# Patient Record
Sex: Male | Born: 1975 | Race: Black or African American | Hispanic: No | Marital: Married | Smoking: Never smoker
Health system: Southern US, Community
[De-identification: ages and names within clinical notes are randomized; demographics above are authoritative.]

---

## 2007-06-21 ENCOUNTER — Encounter: Admission: RE | Admit: 2007-06-21 | Discharge: 2007-06-21 | Payer: Self-pay | Admitting: Family Medicine

## 2007-07-08 ENCOUNTER — Encounter: Admission: RE | Admit: 2007-07-08 | Discharge: 2007-07-08 | Payer: Self-pay | Admitting: Family Medicine

## 2008-01-03 ENCOUNTER — Ambulatory Visit: Payer: Self-pay | Admitting: Hematology & Oncology

## 2008-01-23 LAB — CBC WITH DIFFERENTIAL (CANCER CENTER ONLY)
BASO#: 0 10*3/uL (ref 0.0–0.2)
BASO%: 0.6 % (ref 0.0–2.0)
EOS%: 2 % (ref 0.0–7.0)
Eosinophils Absolute: 0.1 10*3/uL (ref 0.0–0.5)
HCT: 44.2 % (ref 38.7–49.9)
HGB: 15.1 g/dL (ref 13.0–17.1)
LYMPH#: 1.8 10*3/uL (ref 0.9–3.3)
LYMPH%: 57.5 % — ABNORMAL HIGH (ref 14.0–48.0)
MCH: 30.4 pg (ref 28.0–33.4)
MCHC: 34.1 g/dL (ref 32.0–35.9)
MCV: 89 fL (ref 82–98)
MONO#: 0.3 10*3/uL (ref 0.1–0.9)
MONO%: 9.2 % (ref 0.0–13.0)
NEUT#: 1 10*3/uL — ABNORMAL LOW (ref 1.5–6.5)
NEUT%: 30.7 % — ABNORMAL LOW (ref 40.0–80.0)
Platelets: 299 10*3/uL (ref 145–400)
RBC: 4.96 10*6/uL (ref 4.20–5.70)
RDW: 12.8 % (ref 10.5–14.6)
WBC: 3.1 10*3/uL — ABNORMAL LOW (ref 4.0–10.0)

## 2008-01-23 LAB — CHCC SATELLITE - SMEAR

## 2009-07-28 ENCOUNTER — Encounter: Admission: RE | Admit: 2009-07-28 | Discharge: 2009-07-28 | Payer: Self-pay | Admitting: Neurosurgery

## 2010-01-04 ENCOUNTER — Encounter: Admission: RE | Admit: 2010-01-04 | Discharge: 2010-01-04 | Payer: Self-pay | Admitting: Rehabilitation

## 2010-04-25 ENCOUNTER — Encounter: Payer: Self-pay | Admitting: Family Medicine

## 2012-04-27 IMAGING — CT CT CERVICAL SPINE W/O CM
3 of 4 series · 16 of 28 positions shown, 18 images · non-contrast
Comparison: None.

CLINICAL DATA: 34-year-old male for evaluation of surgical
hardware.

CT CERVICAL SPINE WITHOUT CONTRAST
TECHNIQUE: Multidetector CT imaging of the cervical spine was
performed. Multiplanar CT image reconstructions were also
generated.

[Series 2: c spine bone · axial · 0.25mm/px · z∈[+8,+128]mm · 5 of 74 slices shown, 7 images]
[im 13/74  soft-tissue]
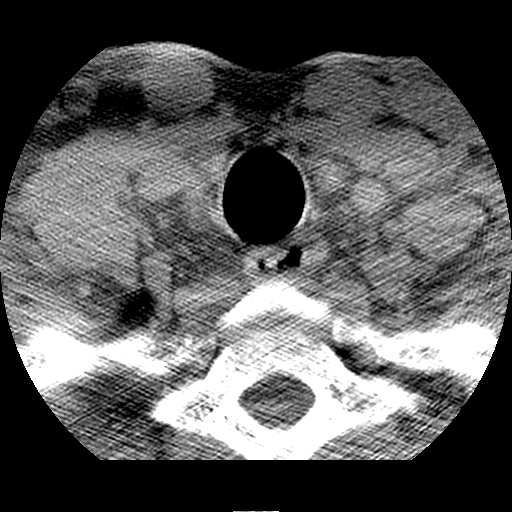
[im 13/74  bone]
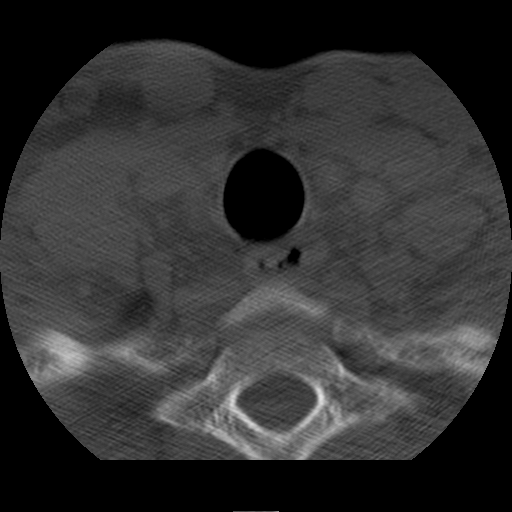
[im 25/74  bone]
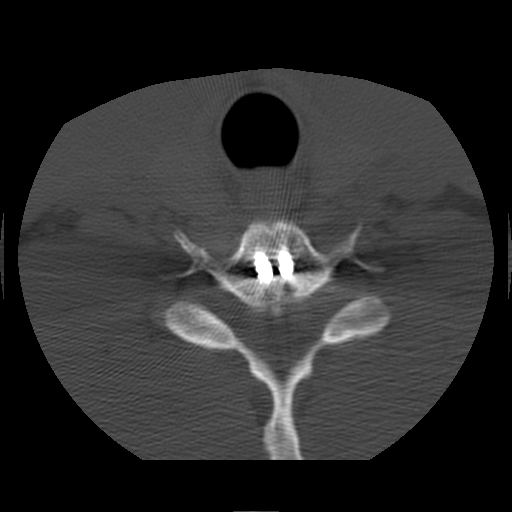
[im 37/74  bone]
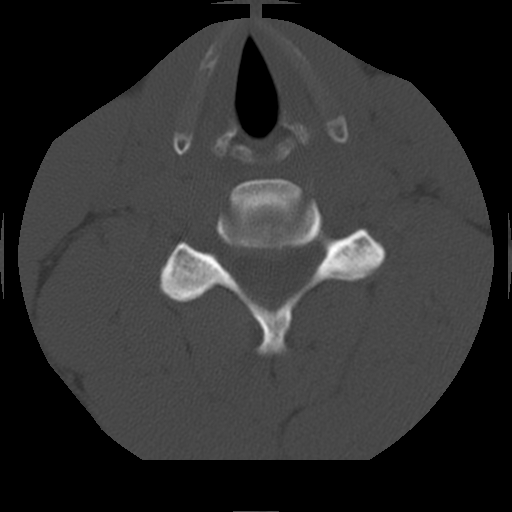
[im 49/74  bone]
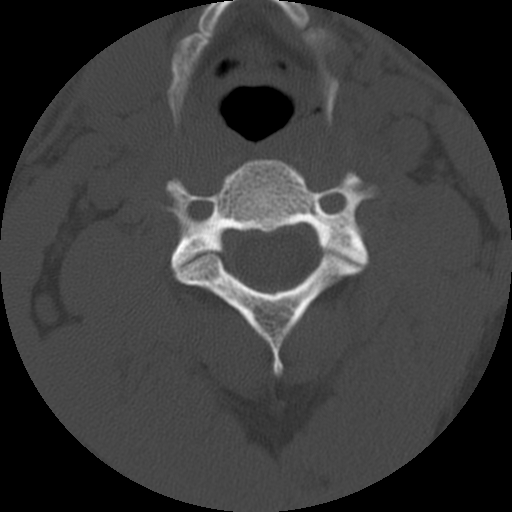
[im 61/74  soft-tissue]
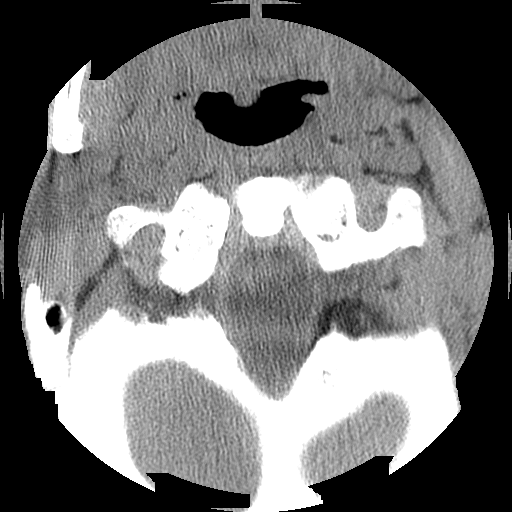
[im 61/74  bone]
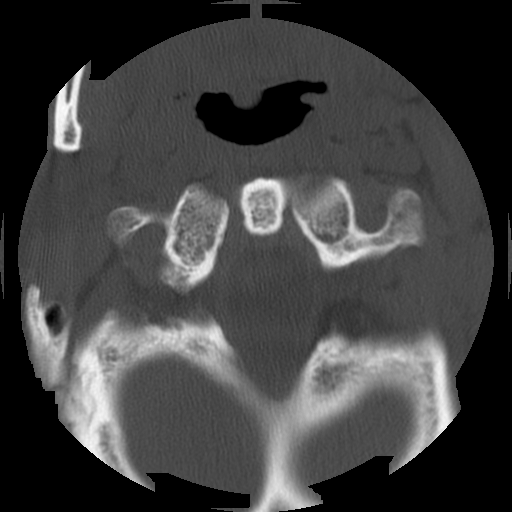

[Series 3: c spine soft · axial · 0.25mm/px · z∈[+8,+128]mm · 5 of 74 slices shown]
[im 13/74  soft-tissue]
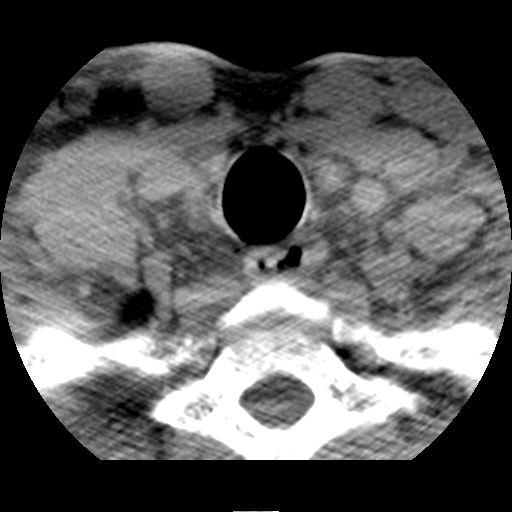
[im 25/74  soft-tissue]
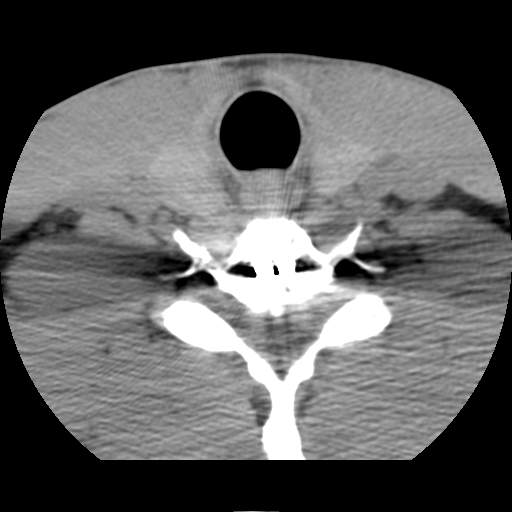
[im 37/74  soft-tissue]
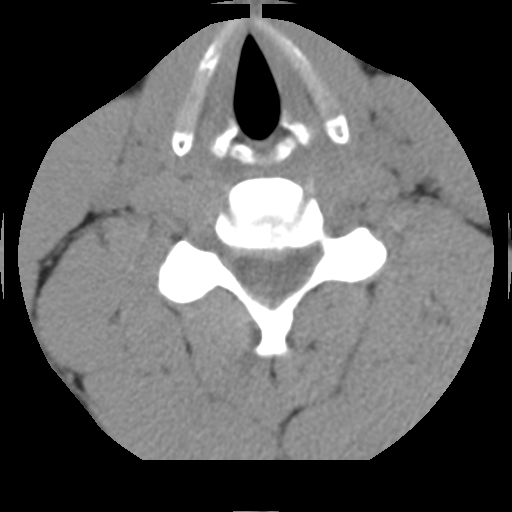
[im 49/74  soft-tissue]
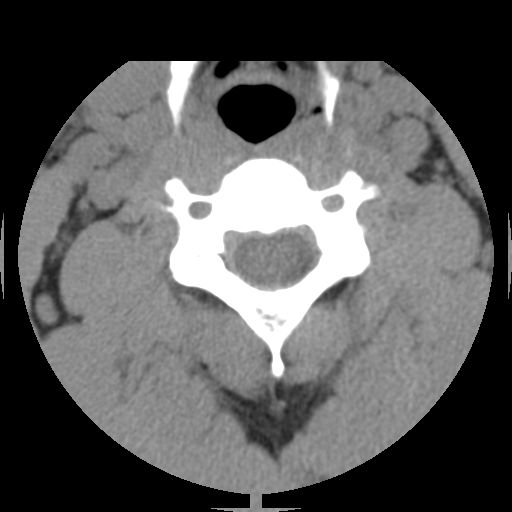
[im 61/74  soft-tissue]
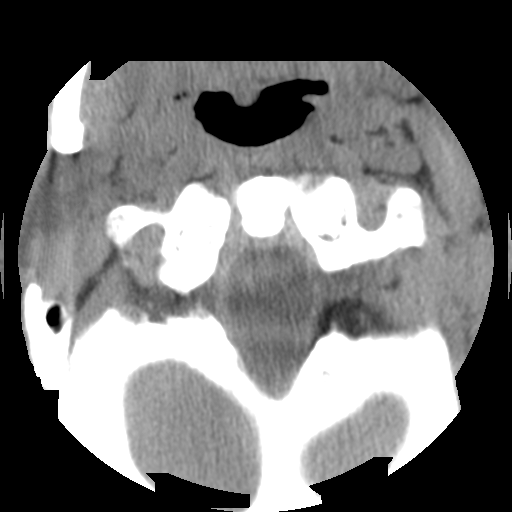

[Series 400: cor · coronal · 0.37mm/px · 6 of 40 slices shown]
[im 7/40  bone]
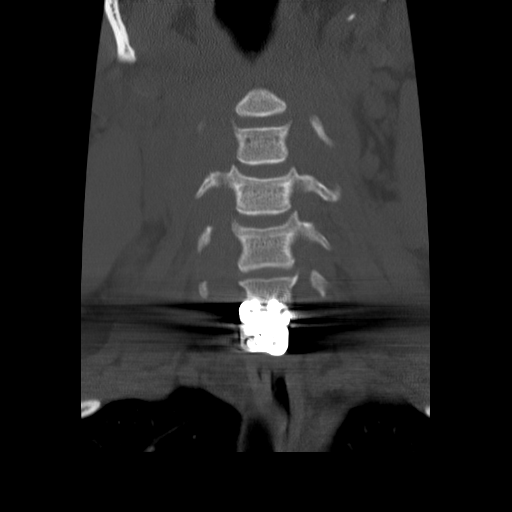
[im 11/40  soft-tissue]
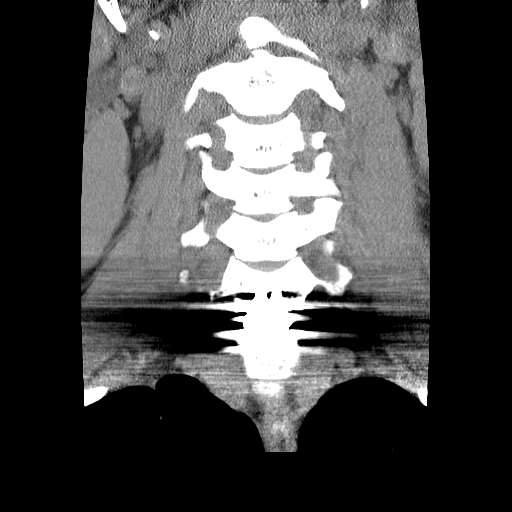
[im 14/40  bone]
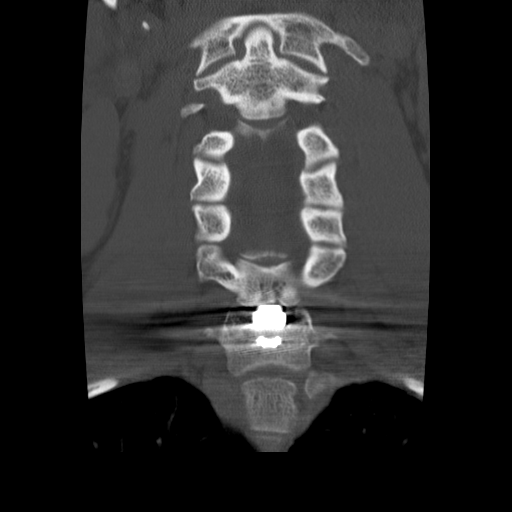
[im 20/40  bone]
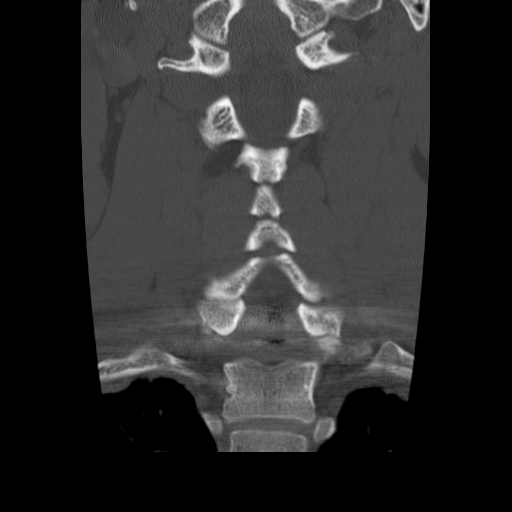
[im 27/40  bone]
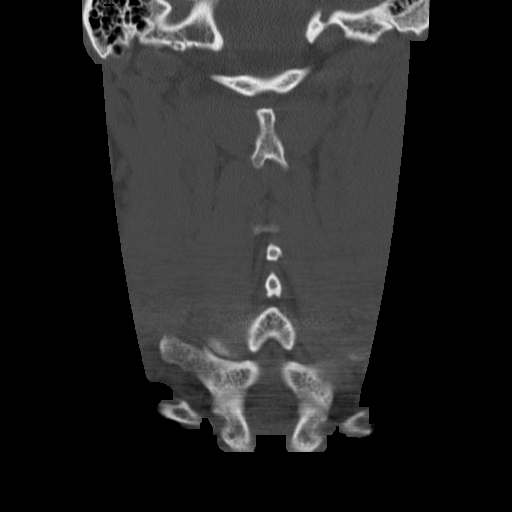
[im 33/40  bone]
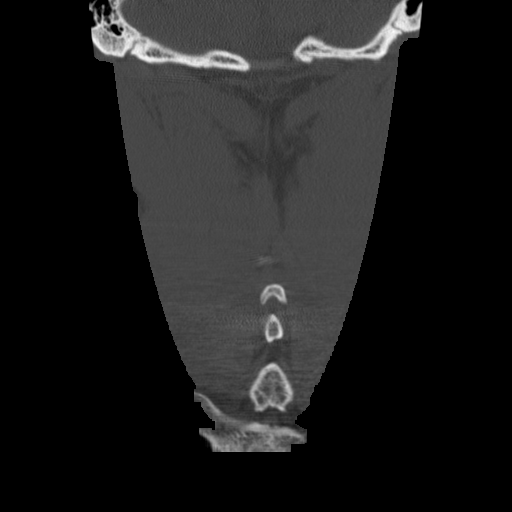

[16 of 28 positions shown; findings below may reference images not displayed]

FINDINGS: Visualized lung apices are clear.  Visualized paraspinal
soft tissues are within normal limits; there is significant CT
streak artifact at the operated level, C6-C7 related the hardware.
Normal alignment at the skull base.  Relatively preserved cervical
lordosis. Cervicothoracic junction alignment is within normal
limits.  Bilateral posterior element alignment is within normal
limits.  No acute osseous abnormality identified.

C2-C3:  Negative.

C3-C4:  Mild right eccentric disc protrusion.  Mild to moderate
uncovertebral hypertrophy greater on the right.  Right facet
hypertrophy.  Borderline spinal stenosis.  Mild to moderate right
C4 foraminal stenosis.

C4-C5:  Mild broad-based disc protrusion.  Mild facet and
uncovertebral hypertrophy.  Borderline to mild spinal stenosis.
Mild bilateral C5 foraminal stenosis.

C5-C6:  Circumferential disc osteophyte complex with left
paracentral focal protrusion.  AP thecal sac is estimated at 6 mm
suggesting mild to moderate spinal stenosis.  Mild right
uncovertebral hypertrophy.  Mild right C6 foraminal stenosis.

C6-C7:  Disc arthroplasty device in place.  Position appears within
normal limits.  Hardware appears intact.  Streak artifact at the
level obscures the thecal sac.  No definite foraminal stenosis.

C7-T1:  Negative.

T1-T2:  Mild uncovertebral hypertrophy, greater on the right.  Mild
right T1 foraminal stenosis.
IMPRESSION: 1.  C6-C7 disc arthroplasty device appears within normal limits.
Streak artifact from hardware obscures some detail of this level.
2.  Cervical degenerative changes elsewhere, with multifactorial
mild to moderate spinal stenosis suspected at C5-C6, and multilevel
predominately mild foraminal stenosis as detailed above.

## 2013-01-15 DIAGNOSIS — Z0289 Encounter for other administrative examinations: Secondary | ICD-10-CM

## 2017-07-30 ENCOUNTER — Other Ambulatory Visit: Payer: Self-pay

## 2017-07-30 ENCOUNTER — Encounter (HOSPITAL_COMMUNITY): Payer: Self-pay | Admitting: *Deleted

## 2017-07-30 ENCOUNTER — Emergency Department (HOSPITAL_COMMUNITY)
Admission: EM | Admit: 2017-07-30 | Discharge: 2017-07-30 | Disposition: A | Discharge status: Home / Self Care | Payer: 59 | Attending: Emergency Medicine | Admitting: Emergency Medicine

## 2017-07-30 DIAGNOSIS — M549 Dorsalgia, unspecified: Secondary | ICD-10-CM | POA: Diagnosis present

## 2017-07-30 MED ORDER — TRAMADOL HCL 50 MG PO TABS: 50.0000 mg | ORAL_TABLET | Freq: Four times a day (QID) | ORAL | 0 refills | Status: AC | PRN

## 2017-07-30 MED ORDER — HYDROCODONE-ACETAMINOPHEN 5-325 MG PO TABS
1.0000 | ORAL_TABLET | Freq: Once | ORAL | Status: AC
  Administered 2017-07-30: 1 via ORAL
  Filled 2017-07-30: qty 1

## 2017-07-30 NOTE — Discharge Instructions (Signed)
SEEK IMMEDIATE MEDICAL ATTENTION IF: New numbness, tingling, weakness, or problem with the use of your arms or legs.  Severe back pain not relieved with medications.  Change in bowel or bladder control.  Increasing pain in any areas of the body (such as chest or abdominal pain).  Shortness of breath, dizziness or fainting.  Nausea (feeling sick to your stomach), vomiting, fever, or sweats.  

## 2017-07-30 NOTE — ED Triage Notes (Signed)
The pt had a sudden onset of thoracic spine pain that started 2130  More severe 0200a  He took  of ibu then

## 2017-07-30 NOTE — ED Provider Notes (Signed)
[facility] EMERGENCY DEPARTMENT Provider Note   CSN: [id number] Arrival date & time: 07/30/17  7829     History   Chief Complaint Chief Complaint  Patient presents with  . Back Pain    HPI Blayde Bacigalupi is a 42 y.o. male  Who presents with c/o back pain. The patient has a hx of musculoskeletal back pain. He states that yesterday he was in class. He began having pain in his Upper R thoracic region. Pain worsened overnight. It is constatnt and non radiating. Worse with deep breathing mvmt of the R arm or leg. He was unable to sleep or find a comfortable position to sleep. He took 500 of robaxin, sleeping medicine and 800 of ibuprofen. He used moist heat and massage machine. He got no relief. His pain is worse with deep breathing, movement of the arm or leg, changing positions. He regularly gets dry needling and massage therapy.  He denies unilateral leg swelling, recent confinement surgery or injury, cigarette smoking, history of DVT or PE. HPI  History reviewed. No pertinent past medical history.  There are no active problems to display for this patient.   History reviewed. No pertinent surgical history.      Home Medications    Prior to Admission medications   Not on File    Family History No family history on file.  Social History Social History   Tobacco Use  . Smoking status: Never Smoker  . Smokeless tobacco: Never Used  Substance Use Topics  . Alcohol use: Yes  . Drug use: Not on file     Allergies   Patient has no known allergies.   Review of Systems Review of Systems  Ten systems reviewed and are negative for acute change, except as noted in the HPI.   Physical Exam Updated Vital Signs BP (!) 142/90   Pulse 76   Temp 98.7 F (37.1 C)   Resp 18   Ht  (1.93 m)   Wt 106.6 kg (235 lb)   SpO2 100%   BMI 28.61 kg/m   Physical Exam  Constitutional: He is oriented to person, place, and time. He appears well-developed and  well-nourished. No distress.  HENT:  Head: Normocephalic and atraumatic.  Eyes: Pupils are equal, round, and reactive to light. Conjunctivae and EOM are normal. No scleral icterus.  Neck: Normal range of motion. Neck supple.  Cardiovascular: Normal rate, regular rhythm and normal heart sounds.  Pulmonary/Chest: Effort normal and breath sounds normal. No respiratory distress.  Abdominal: Soft. There is no tenderness.  Musculoskeletal: He exhibits no edema.  Spastic tissue of the entire right side of the Back. Multiple trigger points. Pain illicited with movement of the arm, twisting  Neurological: He is alert and oriented to person, place, and time.  Skin: Skin is warm and dry. He is not diaphoretic.  Psychiatric: His behavior is normal.  Nursing note and vitals reviewed.     ED Treatments / Results  Labs (all labs ordered are listed, but only abnormal results are displayed) Labs Reviewed - No data to display  EKG None  Radiology No results found.  Procedures Procedures (including critical care time)  Medications Ordered in ED Medications - No data to display   Initial Impression / Assessment and Plan / ED Course  I have reviewed the triage vital signs and the nursing notes.  Pertinent labs & imaging results that were available during my care of the patient were reviewed by me and considered  in my medical decision making (see chart for details).     Patient with apparent musculoskeletal back pain.  He is PERC negative. Patient given pain medications and tramadol at discharge.  He has access to outpatient care and is advised to follow closely with his PCP.  Gust return precautions with the patient.  He appears appropriate for discharge.  Discussed with Dr. Erma Heritage who agrees with discharge.  Final Clinical Impressions(s) / ED Diagnoses   Final diagnoses:  Musculoskeletal back pain    ED Discharge Orders    None       Arthor Captain, PA-C 07/30/17 1608      Shaune Pollack, MD 07/31/17 1330

## 2017-07-30 NOTE — ED Notes (Signed)
Pt ambulated to [facility] slowly- c/o id back pain, with spasms-- started approx 10pm, states he took robaxin, ibuprofen, and used heat and massage without relief. Lung clear.

## 2024-02-07 ENCOUNTER — Emergency Department (HOSPITAL_BASED_OUTPATIENT_CLINIC_OR_DEPARTMENT_OTHER): Payer: Self-pay | Admitting: Radiology

## 2024-02-07 ENCOUNTER — Emergency Department (HOSPITAL_BASED_OUTPATIENT_CLINIC_OR_DEPARTMENT_OTHER): Payer: Self-pay

## 2024-02-07 ENCOUNTER — Emergency Department (HOSPITAL_BASED_OUTPATIENT_CLINIC_OR_DEPARTMENT_OTHER)
Admission: EM | Admit: 2024-02-07 | Discharge: 2024-02-07 | Disposition: A | Discharge status: Home / Self Care | Payer: Self-pay | Attending: Emergency Medicine | Admitting: Emergency Medicine

## 2024-02-07 ENCOUNTER — Other Ambulatory Visit: Payer: Self-pay

## 2024-02-07 DIAGNOSIS — R079 Chest pain, unspecified: Secondary | ICD-10-CM | POA: Insufficient documentation

## 2024-02-07 DIAGNOSIS — R202 Paresthesia of skin: Secondary | ICD-10-CM | POA: Insufficient documentation

## 2024-02-07 DIAGNOSIS — R519 Headache, unspecified: Secondary | ICD-10-CM | POA: Insufficient documentation

## 2024-02-07 LAB — BASIC METABOLIC PANEL WITH GFR
Anion gap: 10 (ref 5–15)
BUN: 9 mg/dL (ref 6–20)
CO2: 28 mmol/L (ref 22–32)
Calcium: 9.8 mg/dL (ref 8.9–10.3)
Chloride: 103 mmol/L (ref 98–111)
Creatinine, Ser: 1.18 mg/dL (ref 0.61–1.24)
GFR, Estimated: >60 mL/min (ref >60)
Glucose, Bld: 99 mg/dL (ref 70–99)
Potassium: 4 mmol/L (ref 3.5–5.1)
Sodium: 141 mmol/L (ref 135–145)

## 2024-02-07 LAB — TROPONIN T, HIGH SENSITIVITY
Troponin T High Sensitivity: <15 ng/L (ref 0–19)
Troponin T High Sensitivity: <15 ng/L (ref 0–19)

## 2024-02-07 LAB — CBC
HCT: 43.7 % (ref 39.0–52.0)
Hemoglobin: 14.8 g/dL (ref 13.0–17.0)
MCH: 30.8 pg (ref 26.0–34.0)
MCHC: 33.9 g/dL (ref 30.0–36.0)
MCV: 91 fL (ref 80.0–100.0)
Platelets: 290 K/uL (ref 150–400)
RBC: 4.8 MIL/uL (ref 4.22–5.81)
RDW: 12.4 % (ref 11.5–15.5)
WBC: 4.4 K/uL (ref 4.0–10.5)
nRBC: 0 % (ref 0.0–0.2)

## 2024-02-07 MED ORDER — LACTATED RINGERS IV BOLUS
1000.0000 mL | Freq: Once | INTRAVENOUS | Status: AC
  Administered 2024-02-07: 1000 mL via INTRAVENOUS

## 2024-02-07 MED ORDER — PROCHLORPERAZINE EDISYLATE 10 MG/2ML IJ SOLN
5.0000 mg | Freq: Once | INTRAMUSCULAR | Status: AC
  Administered 2024-02-07: 5 mg via INTRAVENOUS
  Filled 2024-02-07: qty 2

## 2024-02-07 MED ORDER — KETOROLAC TROMETHAMINE 15 MG/ML IJ SOLN
15.0000 mg | Freq: Once | INTRAMUSCULAR | Status: DC
  Filled 2024-02-07: qty 1

## 2024-02-07 MED ORDER — DIPHENHYDRAMINE HCL 50 MG/ML IJ SOLN
12.5000 mg | Freq: Once | INTRAMUSCULAR | Status: AC
  Administered 2024-02-07: 12.5 mg via INTRAVENOUS
  Filled 2024-02-07: qty 1

## 2024-02-07 MED ORDER — ACETAMINOPHEN 500 MG PO TABS
1000.0000 mg | ORAL_TABLET | Freq: Once | ORAL | Status: AC
  Administered 2024-02-07: 1000 mg via ORAL
  Filled 2024-02-07: qty 2

## 2024-02-07 NOTE — ED Provider Notes (Signed)
 [place] EMERGENCY DEPARTMENT AT [facility] Provider Note   CSN: [id number] Arrival date & time: 02/07/24  1349     Patient presents with: Chest Pain   Thomas Daugherty is a 48 y.o. male with h/o cyclic neutropenia, bipolar, HLD, cervical spine surgery presents to the ER today for evaluation of left arm tingling with some intermittent chest pain since this morning around 0800. The patient reports that he was sitting at his desk when she started to feel numbness and tingling down his left arm to his hand. Reports it felt heavy at times. He reports he started to have intermittent chest pain. It was left sided and intermittent last <1 minute and would happen around 2 times an hour. He reports that it was becoming stronger throughout the day which prompted him to come to the ER.  He denies any shortness of breath, nausea, diaphoresis, palpitations, lower leg swelling, trouble walking, trouble talking.  Denies any lightheadedness, dizziness, or syncope.  Reports that he started to feel some left-sided headache.  He reports he usually has a left-sided facial spasm whenever he is stressed however he reports that his stress level is at his baseline.  He reports this happened around 1330.  Not sudden in onset.  Reports it is a 1-2 out of 10.  Denies any worsening of his neck pain.   Chest Pain Associated symptoms: no abdominal pain, no diaphoresis, no fever, no nausea, no palpitations, no shortness of breath and no vomiting        Prior to Admission medications   Medication Sig Start Date End Date Taking? Authorizing Provider  traMADol  (ULTRAM ) 50 MG tablet Take 1 tablet (50 mg total) by mouth every 6 (six) hours as needed. 07/30/17   Harris, Abigail, PA-C    Allergies: Patient has no known allergies.    Review of Systems  Constitutional:  Negative for chills, diaphoresis and fever.  Respiratory:  Negative for shortness of breath.   Cardiovascular:  Positive for chest pain. Negative for  palpitations and leg swelling.  Gastrointestinal:  Negative for abdominal pain, nausea and vomiting.  Musculoskeletal:  Negative for neck pain.  Neurological:  Negative for syncope and light-headedness.    Updated Vital Signs BP (!) 136/95   Pulse (!) 57   Temp 98.1 F (36.7 C) (Oral)   Resp 16   SpO2 99%   Physical Exam Vitals and nursing note reviewed.  Constitutional:      General: He is not in acute distress.    Appearance: He is not ill-appearing or toxic-appearing.  Eyes:     Extraocular Movements: Extraocular movements intact.     Pupils: Pupils are equal, round, and reactive to light.  Neck:     Comments: No midline tenderness palpation. Cardiovascular:     Rate and Rhythm: Normal rate.     Pulses:          Radial pulses are 2+ on the right side and 2+ on the left side.       Dorsalis pedis pulses are 2+ on the right side and 2+ on the left side.       Posterior tibial pulses are 2+ on the right side and 2+ on the left side.  Pulmonary:     Effort: Pulmonary effort is normal. No respiratory distress.     Breath sounds: No decreased breath sounds or wheezing.  Chest:     Chest wall: No tenderness.  Musculoskeletal:     Right lower leg: No edema.  Left lower leg: No edema.  Skin:    General: Skin is warm and dry.  Neurological:     Mental Status: He is alert.     GCS: GCS eye subscore is 4. GCS verbal subscore is 5. GCS motor subscore is 6.     Cranial Nerves: No cranial nerve deficit, dysarthria or facial asymmetry.     Sensory: No sensory deficit.     Motor: No weakness or pronator drift.     Comments: Strength is 5-5 in patient's upper and lower bilateral extremities.  Palpable pulses.  Compartments are soft.  He reports in sensation is intact and symmetric throughout the bilateral upper and lower extremities.     (all labs ordered are listed, but only abnormal results are displayed) Labs Reviewed  BASIC METABOLIC PANEL WITH GFR  CBC  TROPONIN T, HIGH  SENSITIVITY  TROPONIN T, HIGH SENSITIVITY    EKG: EKG Interpretation Date/Time:  Wednesday February 07 2024 14:05:33 EST Ventricular Rate:  68 PR Interval:  178 QRS Duration:  87 QT Interval:  368 QTC Calculation: 392 R Axis:   -37  Text Interpretation: Sinus rhythm Left axis deviation Anteroseptal infarct, old No previous ECGs available Confirmed by Ellouise Fine ([id number]) on 02/07/2024 2:46:37 PM  Radiology: CT Head Wo Contrast Result Date: 02/07/2024 EXAM: CT HEAD WITHOUT CONTRAST 02/07/2024 04:27:02 PM TECHNIQUE: CT of the head was performed without the administration of intravenous contrast. Automated exposure control, iterative reconstruction, and/or weight based adjustment of the mA/kV was utilized to reduce the radiation dose to as low as reasonably achievable. COMPARISON: None available. CLINICAL HISTORY: Headache, increasing frequency or severity. FINDINGS: BRAIN AND VENTRICLES: No acute hemorrhage. No evidence of acute infarct. No hydrocephalus. No extra-axial collection. No mass effect or midline shift. Benign dilated perivascular space in the right basal ganglia. ORBITS: No acute abnormality. SINUSES: No acute abnormality. SOFT TISSUES AND SKULL: No acute soft tissue abnormality. No skull fracture. IMPRESSION: 1. No acute intracranial abnormality. Electronically signed by: Gilmore Molt MD 02/07/2024 04:36 PM EST RP Workstation: [id number]   DG Chest 2 View Result Date: 02/07/2024 EXAM: 2 VIEW(S) XRAY OF THE CHEST 02/07/2024 02:26:00 PM COMPARISON: None available. CLINICAL HISTORY: Chest pain. FINDINGS: LUNGS AND PLEURA: No focal pulmonary opacity. No pulmonary edema. No pleural effusion. No pneumothorax. HEART AND MEDIASTINUM: No acute abnormality of the cardiac and mediastinal silhouettes. BONES AND SOFT TISSUES: No acute osseous abnormality. IMPRESSION: 1. No acute process. Electronically signed by: Lynwood Seip MD 02/07/2024 02:50 PM EST RP Workstation: [id number]    Procedures   Medications Ordered in the ED  ketorolac (TORADOL) 15 MG/ML injection 15 mg (15 mg Intravenous Patient Refused/Not Given 02/07/24 1746)  prochlorperazine (COMPAZINE) injection 5 mg (5 mg Intravenous Given 02/07/24 1658)  diphenhydrAMINE (BENADRYL) injection 12.5 mg (12.5 mg Intravenous Given 02/07/24 1656)  lactated ringers bolus 1,000 mL (0 mLs Intravenous Stopped 02/07/24 1809)  acetaminophen  (TYLENOL ) tablet 1,000 mg (1,000 mg Oral Given 02/07/24 1655)                                Medical Decision Making Amount and/or Complexity of Data Reviewed Labs: ordered. Radiology: ordered.  Risk OTC drugs. Prescription drug management.   48 y.o. male presents to the ER for evaluation of headache, chest pain, and left arm paresthesias. Differential diagnosis includes but is not limited to ACS, pericarditis, myocarditis, aortic dissection, PE, pneumothorax, esophageal rupture, pneumonia, reflux/PUD, biliary disease, pancreatitis, costochondritis, anxiety,  Stroke, increased ICP, meningitis, CVA, intracranial tumor, venous sinus thrombosis, migraine, cluster headache, hypertension, drug related, head injury, tension headache, sinusitis, dental abscess, otitis media, TMJ. Vital signs mildly elevated BP, otherwise unremarkable. Physical exam as noted above.   The patient has a benign neurological examination.  Pulses are intact and symmetric throughout the upper and lower extremities.  He has no focal deficit.  Chest wall is nontender to palpation.  There is no color, temperature, or size changes to the upper extremities.  Compartments are soft.  He reports sensations intact and symmetric throughout the upper extremities bilaterally.  Does not sound consistent with dissection or aneurysm.  He is not having any shortness of breath.  Low suspicion for any PE.  Will continue with cardiac workup.  I did discuss this is my attending who assessed the patient at bedside.  Recommends ordering a CT of  the head however again agrees that this was not a sudden onset headache.  Will also prescribe migraine cocktail.  He does not have a focal neurological exam.  Doubt any stroke.  I independently reviewed and interpreted the patient's labs.  BMP unremarkable.  CBC unremarkable.  Troponin less than 15 with repeat at less than 15.  CT Head shows 1. No acute intracranial abnormality. Per radiologist's interpretation.    CXR shows 1. No acute process. Per radiologist's interpretation.   EKG reviewed and interpreted by an attending and read as Sinus rhythm Left axis deviation Anteroseptal infarct, old No previous ECGs available.    Discussed findings with my attending.  He had already had the discussion with the patient on follow-up.  Patient like to follow-up with his PCP instead of a cardiologist.  He declined the Toradol with migraine cocktail.  He has some mild bradycardia and mildly elevated blood pressure but otherwise not appear in any acute distress.  Given the reassuring troponins and EKG without any acute findings, he stable for discharge home with strict return precautions and close outpatient follow-up.  We discussed the results of the labs/imaging. The plan is follow-up, strict return precautions. We discussed strict return precautions and red flag symptoms. The patient verbalized their understanding and agrees to the plan. The patient is stable and being discharged home in good condition.  I discussed this case with my attending physician who cosigned this note including patient's presenting symptoms, physical exam, and planned diagnostics and interventions. Attending physician stated agreement with plan or made changes to plan which were implemented.   Attending physician assessed patient at bedside.  Portions of this report may have been transcribed using voice recognition software. Every effort was made to ensure accuracy; however, inadvertent computerized transcription errors may be  present.   Final diagnoses:  Nonspecific chest pain  Left-sided headache    ED Discharge Orders     None          Bernis Ernst, DEVONNA 02/07/24 2053    Ula Prentice SAUNDERS, MD 02/07/24 [phone]

## 2024-02-07 NOTE — ED Notes (Signed)
 DC paperwork given and verbally understood.

## 2024-02-07 NOTE — ED Triage Notes (Signed)
 Patient states pain down left arm, chest pain, and left sided headache for the past 8 hours.

## 2024-02-07 NOTE — Discharge Instructions (Addendum)
 You were seen in the ER today for evaluation of your chest pain and left sided headache. Your workup up did not show any emergent findings. Please make sure you follow up with your PCP for re-evaluation of your symptoms. I have included some information into the discharge paperwork for you to review. Make sure that you are staying well hydrated drinking plenty of fluids, mainly water. If you have any concerns, new or worsening symptoms, please return to the nearest ER for evaluation.   Contact a health care provider if: Your chest pain does not go away. You feel depressed. You have a fever. You notice changes in your symptoms or develop new symptoms. Get help right away if: Your chest pain gets worse. You have a cough that gets worse, or you cough up blood. You have severe pain in your abdomen. You faint. You have sudden, unexplained chest discomfort. You have sudden, unexplained discomfort in your arms, back, neck, or jaw. You have shortness of breath at any time. You suddenly start to sweat, or your skin gets clammy. You feel nausea or you vomit. You suddenly feel lightheaded or dizzy. You have severe weakness, or unexplained weakness or fatigue. Your heart begins to beat quickly, or it feels like it is skipping beats. These symptoms may represent a serious problem that is an emergency. Do not wait to see if the symptoms will go away. Get medical help right away. Call your local emergency services (911 in the [place] Do not drive yourself to the hospital.

## 2024-06-10 ENCOUNTER — Ambulatory Visit: Admitting: Family Medicine
